# Patient Record
Sex: Male | Born: 1985
Health system: Southern US, Community
[De-identification: ages and names within clinical notes are randomized; demographics above are authoritative.]

---

## 2019-05-17 ENCOUNTER — Ambulatory Visit: Payer: Self-pay | Admitting: Registered Nurse

## 2019-05-18 ENCOUNTER — Encounter: Payer: Self-pay | Admitting: Registered Nurse

## 2020-09-27 ENCOUNTER — Ambulatory Visit: Payer: BC Managed Care – PPO | Admitting: Internal Medicine

## 2020-09-27 ENCOUNTER — Encounter: Payer: Self-pay | Admitting: Internal Medicine

## 2020-09-27 ENCOUNTER — Other Ambulatory Visit: Payer: Self-pay

## 2020-09-27 VITALS — BP 106/70 | HR 76 | Temp 98.2°F | Resp 16 | Ht 68.0 in | Wt 138.0 lb

## 2020-09-27 DIAGNOSIS — Z0001 Encounter for general adult medical examination with abnormal findings: Secondary | ICD-10-CM

## 2020-09-27 DIAGNOSIS — R300 Dysuria: Secondary | ICD-10-CM | POA: Diagnosis not present

## 2020-09-27 DIAGNOSIS — Z7251 High risk heterosexual behavior: Secondary | ICD-10-CM | POA: Insufficient documentation

## 2020-09-27 DIAGNOSIS — Z0000 Encounter for general adult medical examination without abnormal findings: Secondary | ICD-10-CM

## 2020-09-27 LAB — CBC WITH DIFFERENTIAL/PLATELET
Basophils Absolute: 0 10*3/uL (ref 0.0–0.1)
Basophils Relative: 0.5 % (ref 0.0–3.0)
Eosinophils Absolute: 0.1 10*3/uL (ref 0.0–0.7)
Eosinophils Relative: 1.3 % (ref 0.0–5.0)
HCT: 43.6 % (ref 39.0–52.0)
Hemoglobin: 14.2 g/dL (ref 13.0–17.0)
Lymphocytes Relative: 25 % (ref 12.0–46.0)
Lymphs Abs: 1.4 10*3/uL (ref 0.7–4.0)
MCHC: 32.6 g/dL (ref 30.0–36.0)
MCV: 78.3 fl (ref 78.0–100.0)
Monocytes Absolute: 0.4 10*3/uL (ref 0.1–1.0)
Monocytes Relative: 6.5 % (ref 3.0–12.0)
Neutro Abs: 3.8 10*3/uL (ref 1.4–7.7)
Neutrophils Relative %: 66.7 % (ref 43.0–77.0)
Platelets: 216 10*3/uL (ref 150.0–400.0)
RBC: 5.57 Mil/uL (ref 4.22–5.81)
RDW: 13.9 % (ref 11.5–15.5)
WBC: 5.8 10*3/uL (ref 4.0–10.5)

## 2020-09-27 LAB — URINALYSIS, ROUTINE W REFLEX MICROSCOPIC
Bilirubin Urine: NEGATIVE
Hgb urine dipstick: NEGATIVE
Ketones, ur: NEGATIVE
Leukocytes,Ua: NEGATIVE
Nitrite: NEGATIVE
Specific Gravity, Urine: 1.025 (ref 1.000–1.030)
Total Protein, Urine: NEGATIVE
Urine Glucose: NEGATIVE
Urobilinogen, UA: 0.2 (ref 0.0–1.0)
pH: 7 (ref 5.0–8.0)

## 2020-09-27 LAB — LIPID PANEL
Cholesterol: 126 mg/dL (ref 0–200)
HDL: 42.8 mg/dL (ref >39.00)
LDL Cholesterol: 72 mg/dL (ref 0–99)
NonHDL: 83.26
Total CHOL/HDL Ratio: 3
Triglycerides: 54 mg/dL (ref 0.0–149.0)
VLDL: 10.8 mg/dL (ref 0.0–40.0)

## 2020-09-27 NOTE — Patient Instructions (Signed)

## 2020-09-27 NOTE — Progress Notes (Signed)
Subjective:  Patient ID: Ricky Duran, Duran    DOB: 15-Jul-1986  Age: 34 y.o. MRN: [id number]  CC: Annual Exam  This visit occurred during the SARS-CoV-2 public health emergency.  Safety protocols were in place, including screening questions prior to the visit, additional usage of staff PPE, and extensive cleaning of exam room while observing appropriate contact time as indicated for disinfecting solutions.   Ricky Duran.  HPI Ricky Duran presents for a CPX.  He complains of a 1 week history of dysuria without penile discharge.  His last sexual encounter was 3 weeks ago when he said he met a woman in a convenience store parking lot and they had sex.  He denies testicular pain or swelling, lymphadenopathy, penile lesions or ulcers, rash, fever, chills, abdominal pain, or flank pain.  History Ricky Duran has no past medical history on file.   He has no past surgical history on file.   His family history is not on file.He reports that he has never smoked. He has never used smokeless tobacco. He reports current alcohol use of about 2.0 standard drinks of alcohol per week. He reports that he does not use drugs.  No outpatient medications prior to visit.   No facility-administered medications prior to visit.   History reviewed. No pertinent past medical history. History reviewed. No pertinent surgical history.  reports that he has never smoked. He has never used smokeless tobacco. He reports current alcohol use of about 2.0 standard drinks of alcohol per week. He reports that he does not use drugs. family history is not on file. No Known Allergies  ROS Review of Systems  Constitutional: Negative for appetite change, chills, diaphoresis, fatigue and fever.  HENT: Negative.   Eyes: Negative.   Respiratory: Negative.  Negative for cough and shortness of breath.   Cardiovascular: Negative.  Negative for chest pain and leg swelling.  Gastrointestinal: Negative.  Negative for  abdominal pain, constipation, diarrhea, nausea and vomiting.  Endocrine: Negative.   Genitourinary: Positive for dysuria. Negative for difficulty urinating, flank pain, frequency, genital sores, hematuria, penile discharge, penile pain, penile swelling, scrotal swelling, testicular pain and urgency.  Musculoskeletal: Negative for arthralgias and myalgias.  Skin: Negative for color change, pallor and rash.  Neurological: Negative.  Negative for dizziness, weakness, light-headedness and headaches.  Hematological: Negative for adenopathy. Does not bruise/bleed easily.  Psychiatric/Behavioral: Negative.     Objective:  BP 106/70   Pulse 76   Temp 98.2 F (36.8 C) (Oral)   Resp 16   Ht '5\' 8"'  (1.727 m)   Wt 138 lb (62.6 kg)   SpO2 97%   BMI 20.98 kg/m   Physical Exam Vitals reviewed.  HENT:     Nose: Nose normal.     Mouth/Throat:     Mouth: Mucous membranes are moist.  Eyes:     General: No scleral icterus.    Conjunctiva/sclera: Conjunctivae normal.  Cardiovascular:     Rate and Rhythm: Normal rate and regular rhythm.     Heart sounds: No murmur heard.   Pulmonary:     Effort: Pulmonary effort is normal.     Breath sounds: No stridor. No wheezing, rhonchi or rales.  Abdominal:     General: Abdomen is flat.     Palpations: There is no mass.     Tenderness: There is no abdominal tenderness. There is no guarding.     Hernia: There is no hernia in the left inguinal area or right inguinal area.  Genitourinary:  Pubic Area: No rash.      Penis: Normal and circumcised. No erythema, discharge, swelling or lesions.      Testes: Normal.        Right: Mass, tenderness or swelling not present.        Left: Mass, tenderness or swelling not present.     Epididymis:     Right: Normal. Not inflamed or enlarged. No mass.     Left: Not enlarged. No mass.  Musculoskeletal:        General: Normal range of motion.     Cervical back: Neck supple.     Right lower leg: No edema.      Left lower leg: No edema.  Lymphadenopathy:     Cervical: No cervical adenopathy.     Lower Body: No right inguinal adenopathy. No left inguinal adenopathy.  Skin:    General: Skin is warm and dry.     Coloration: Skin is not pale.     Findings: No rash.  Neurological:     General: No focal deficit present.     Mental Status: He is alert and oriented to person, place, and time. Mental status is at baseline.  Psychiatric:        Mood and Affect: Mood normal.        Behavior: Behavior normal.     No results found for: WBC, HGB, HCT, PLT, GLUCOSE, CHOL, TRIG, HDL, LDLDIRECT, LDLCALC, ALT, AST, NA, K, CL, CREATININE, BUN, CO2, TSH, PSA, INR, GLUF, HGBA1C, MICROALBUR  Assessment & Plan:   Ricky Duran was seen today for annual exam.  Diagnoses and all orders for this visit:  Encounter for general adult medical examination with abnormal findings- Exam completed, labs reviewed, vaccines reviewed - He refused a flu vaccine, no cancer screenings are indicated. -     HIV Antibody (routine testing w rflx); Future -     Lipid panel; Future  Dysuria- Testing for infection is negative.  If his symptoms persist I will treat him for nongonococcal urethritis. -     Chlamydia/Gonococcus/Trichomonas, NAA; Future -     Urinalysis, Routine w reflex microscopic; Future -     RPR; Future -     CULTURE, URINE COMPREHENSIVE; Future -     CBC with Differential/Platelet; Future  High risk heterosexual behavior -     RPR; Future -     HIV Antibody (routine testing w rflx); Future   Ricky Knittel "Na-eem" does not currently have medications on file.  No orders of the defined types were placed in this encounter.    Follow-up: No follow-ups on file.  Ricky Calico, MD

## 2020-09-29 LAB — HIV ANTIBODY (ROUTINE TESTING W REFLEX): HIV 1&2 Ab, 4th Generation: NONREACTIVE

## 2020-09-29 LAB — CULTURE, URINE COMPREHENSIVE: RESULT:: NO GROWTH

## 2020-09-29 LAB — RPR: RPR Ser Ql: NONREACTIVE

## 2020-10-02 ENCOUNTER — Encounter: Payer: Self-pay | Admitting: Internal Medicine

## 2020-10-02 LAB — CHLAMYDIA/GONOCOCCUS/TRICHOMONAS, NAA
Chlamydia by NAA: NEGATIVE
Gonococcus by NAA: NEGATIVE
Trich vag by NAA: NEGATIVE

## 2020-10-19 ENCOUNTER — Ambulatory Visit (INDEPENDENT_AMBULATORY_CARE_PROVIDER_SITE_OTHER): Payer: BC Managed Care – PPO

## 2020-10-19 ENCOUNTER — Ambulatory Visit: Payer: BC Managed Care – PPO | Admitting: Internal Medicine

## 2020-10-19 ENCOUNTER — Encounter: Payer: Self-pay | Admitting: Internal Medicine

## 2020-10-19 ENCOUNTER — Other Ambulatory Visit: Payer: Self-pay

## 2020-10-19 VITALS — BP 102/60 | HR 84 | Temp 98.5°F | Resp 16 | Ht 68.0 in | Wt 137.0 lb

## 2020-10-19 DIAGNOSIS — R109 Unspecified abdominal pain: Secondary | ICD-10-CM

## 2020-10-19 LAB — URINALYSIS, ROUTINE W REFLEX MICROSCOPIC
Bilirubin Urine: NEGATIVE
Hgb urine dipstick: NEGATIVE
Ketones, ur: NEGATIVE
Leukocytes,Ua: NEGATIVE
Nitrite: NEGATIVE
RBC / HPF: NONE SEEN (ref 0–?)
Specific Gravity, Urine: 1.025 (ref 1.000–1.030)
Total Protein, Urine: NEGATIVE
Urine Glucose: NEGATIVE
Urobilinogen, UA: 0.2 (ref 0.0–1.0)
pH: 6.5 (ref 5.0–8.0)

## 2020-10-19 NOTE — Patient Instructions (Signed)
Flank Pain, Adult Flank pain is pain that is located on the side of the body between the upper abdomen and the back. This area is called the flank. The pain may occur over a short period of time (acute), or it may be long-term or recurring (chronic). It may be mild or severe. Flank pain can be caused by many things, including:  Muscle soreness or injury.  Kidney stones or kidney disease.  Stress.  A disease of the spine (vertebral disk disease).  A lung infection (pneumonia).  Fluid around the lungs (pulmonary edema).  A skin rash caused by the chickenpox virus (shingles).  Tumors that affect the back of the abdomen.  Gallbladder disease. Follow these instructions at home:   Drink enough fluid to keep your urine clear or pale yellow.  Rest as told by your health care provider.  Take over-the-counter and prescription medicines only as told by your health care provider.  Keep a journal to track what has caused your flank pain and what has made it feel better.  Keep all follow-up visits as told by your health care provider. This is important. Contact a health care provider if:  Your pain is not controlled with medicine.  You have new symptoms.  Your pain gets worse.  You have a fever.  Your symptoms last longer than 2-3 days.  You have trouble urinating or you are urinating very frequently. Get help right away if:  You have trouble breathing or you are short of breath.  Your abdomen hurts or it is swollen or red.  You have nausea or vomiting.  You feel faint or you pass out.  You have blood in your urine. Summary  Flank pain is pain that is located on the side of the body between the upper abdomen and the back.  The pain may occur over a short period of time (acute), or it may be long-term or recurring (chronic). It may be mild or severe.  Flank pain can be caused by many things.  Contact your health care provider if your symptoms get worse or they last  longer than 2-3 days. This information is not intended to replace advice given to you by your health care provider. Make sure you discuss any questions you have with your health care provider. Document Revised: 09/12/2017 Document Reviewed: 12/13/2016 Elsevier Patient Education  2020 [employer]  

## 2020-10-19 NOTE — Progress Notes (Signed)
Subjective:  Patient ID: Ricky Duran, male    DOB: 1985/11/16  Age: 35 y.o. MRN: [id number]  CC: Abdominal Pain  This visit occurred during the SARS-CoV-2 public health emergency.  Safety protocols were in place, including screening questions prior to the visit, additional usage of staff PPE, and extensive cleaning of exam room while observing appropriate contact time as indicated for disinfecting solutions.    HPI Ricky Duran presents for f/up - I recently saw him for dysuria and concerns for urethritis.  The testing was all normal.  He tells me the dysuria has resolved.  He returns today because he has noticed over the last 3 weeks that whenever he drinks beer he has discomfort in both flanks.  He denies abdominal pain, nausea, vomiting, loss of appetite, weight loss, dysuria, or hematuria.  No outpatient medications prior to visit.   No facility-administered medications prior to visit.    ROS Review of Systems  Constitutional: Negative.  Negative for chills, diaphoresis, fatigue and fever.  HENT: Negative.   Eyes: Negative for visual disturbance.  Respiratory: Negative for cough, chest tightness, shortness of breath and wheezing.   Cardiovascular: Negative for chest pain, palpitations and leg swelling.  Gastrointestinal: Negative for abdominal pain, constipation, diarrhea, nausea and vomiting.  Endocrine: Negative.   Genitourinary: Positive for flank pain. Negative for difficulty urinating, dysuria, hematuria, scrotal swelling and testicular pain.  Musculoskeletal: Negative for arthralgias and back pain.  Skin: Negative.  Negative for color change and pallor.  Neurological: Negative.  Negative for dizziness, weakness, light-headedness and numbness.  Hematological: Negative for adenopathy. Does not bruise/bleed easily.  Psychiatric/Behavioral: Negative.     Objective:  BP 102/60   Pulse 84   Temp 98.5 F (36.9 C) (Oral)   Resp 16   Ht 5\' 8"  (1.727 m)   Wt 137  lb (62.1 kg)   SpO2 98%   BMI 20.83 kg/m   BP Readings from Last 3 Encounters:  10/19/20 102/60  09/27/20 106/70    Wt Readings from Last 3 Encounters:  10/19/20 137 lb (62.1 kg)  09/27/20 138 lb (62.6 kg)    Physical Exam Vitals reviewed.  Constitutional:      Appearance: He is well-developed.  HENT:     Nose: Nose normal.     Mouth/Throat:     Mouth: Mucous membranes are moist.  Eyes:     General: No scleral icterus.    Conjunctiva/sclera: Conjunctivae normal.  Cardiovascular:     Rate and Rhythm: Normal rate and regular rhythm.     Heart sounds: No murmur heard.   Pulmonary:     Effort: Pulmonary effort is normal.     Breath sounds: No stridor. No wheezing, rhonchi or rales.  Abdominal:     General: Abdomen is flat. Bowel sounds are normal. There is no distension.     Palpations: Abdomen is soft. There is no hepatomegaly, splenomegaly or mass.     Tenderness: There is no abdominal tenderness. There is no right CVA tenderness or left CVA tenderness.  Musculoskeletal:        General: Normal range of motion.     Cervical back: Neck supple.     Right lower leg: No edema.     Left lower leg: No edema.  Lymphadenopathy:     Cervical: No cervical adenopathy.  Skin:    General: Skin is warm and dry.     Findings: No rash.  Neurological:     General: No focal deficit present.  Mental Status: He is alert.  Psychiatric:        Mood and Affect: Mood normal.        Behavior: Behavior normal.     Lab Results  Component Value Date   WBC 5.8 09/27/2020   HGB 14.2 09/27/2020   HCT 43.6 09/27/2020   PLT 216.0 09/27/2020   CHOL 126 09/27/2020   TRIG 54.0 09/27/2020   HDL 42.80 09/27/2020   LDLCALC 72 09/27/2020   FINDINGS: There is no evidence of dilated bowel loops or free intraperitoneal air. No radiopaque calculi or other significant radiographic abnormality is seen. Heart size and mediastinal contours are within normal limits. Both lungs are  clear.  IMPRESSION: Negative abdominal radiographs.  No acute cardiopulmonary disease.   Electronically Signed   By: Marlan Palau M.D.   On: 10/19/2020 14:07   Assessment & Plan:   [name] was seen today for abdominal pain.  Diagnoses and all orders for this visit:  Bilateral flank pain- He has a strange discomfort in his flanks only when he drinks beer.  His examination is reassuring.  His urinalysis and plain films are normal.  I think this is either dyspepsia or musculoskeletal pain.  I recommended that he stop drinking beer. -     Urinalysis, Routine w reflex microscopic; Future -     DG ABD ACUTE 2+V W 1V CHEST; Future -     Urinalysis, Routine w reflex microscopic   Ricky Duran "Na-eem" does not currently have medications on file.  No orders of the defined types were placed in this encounter.    Follow-up: Return if symptoms worsen or fail to improve.  Sanda Linger, MD

## 2020-11-30 ENCOUNTER — Telehealth (INDEPENDENT_AMBULATORY_CARE_PROVIDER_SITE_OTHER): Payer: BC Managed Care – PPO | Admitting: Family Medicine

## 2020-11-30 DIAGNOSIS — J111 Influenza due to unidentified influenza virus with other respiratory manifestations: Secondary | ICD-10-CM

## 2020-11-30 NOTE — Addendum Note (Signed)
Addended by: Terressa Koyanagi on: 11/30/2020 03:13 PM   Modules accepted: Level of Service

## 2020-11-30 NOTE — Progress Notes (Signed)
Virtual Visit via Video Note  I connected with Ricky Duran  on 11/30/20 at  3:00 PM EST by a video enabled telemedicine application and verified that I am speaking with the correct person using two identifiers.  Location patient: home, [place] Location provider:work or home office Persons participating in the virtual visit: patient, provider  I discussed the limitations of evaluation and management by telemedicine and the availability of in person appointments. The patient expressed understanding and agreed to proceed.   HPI:  Acute telemedicine visit for "flu": -Onset: 5 days ago -Symptoms include: fever, sore throat, ear discomfort, body aches, runny nose, cough -feeling much better now with resolution of most symptoms -took covid test 3 days ago - negative; he has a nother test scheduled for tomorrow -Denies: SOB, CP, NVD, inability to eat/drink -Pertinent past medical history:none per pt -Pertinent medication allergies:nkda -COVID-19 vaccine status: not vaccinated for covid or flu  ROS: See pertinent positives and negatives per HPI.  No past medical history on file.  No past surgical history on file.  No current outpatient medications on file.  EXAM:  VITALS per patient if applicable:  GENERAL: alert, oriented, appears well and in no acute distress  HEENT: atraumatic, conjunttiva clear, no obvious abnormalities on inspection of external nose and ears  NECK: normal movements of the head and neck  LUNGS: on inspection no signs of respiratory distress, breathing rate appears normal, no obvious gross SOB, gasping or wheezing  CV: no obvious cyanosis  MS: moves all visible extremities without noticeable abnormality  PSYCH/NEURO: pleasant and cooperative, no obvious depression or anxiety, speech and thought processing grossly intact  ASSESSMENT AND PLAN:  Discussed the following assessment and plan:  Influenza-like illness  -we discussed possible serious and likely  etiologies, options for evaluation and workup, limitations of telemedicine visit vs in person visit, treatment, treatment risks and precautions. Pt prefers to treat via telemedicine empirically rather than in person at this moment.  Query viral illness, influenza versus other.  He plans to do a second Covid test, as there have been a fair number of false negative test recently with the variant.  He reports he is doing mostly better at this point and declines any prescriptions for symptomatic care.  He does want a work note.   Work/School slipped offered: provided in patient instructions   Scheduled follow up with PCP offered: Advised to schedule follow-up visit through his primary care office if needed if any recurrent symptoms, worsening or new symptoms arise. Advised to seek prompt in person care if worsening or new symptoms.  Discussed options for inperson care if PCP office not available. Did let this patient know that I only do telemedicine on Tuesdays and Thursdays for [place]. Advised to schedule follow up visit with PCP or [facility] if any further questions or concerns to avoid delays in care.   I discussed the assessment and treatment plan with the patient. The patient was provided an opportunity to ask questions and all were answered. The patient agreed with the plan and demonstrated an understanding of the instructions.     Terressa Koyanagi, DO

## 2020-11-30 NOTE — Patient Instructions (Addendum)
   ---------------------------------------------------------------------------------------------------------------------------      WORK SLIP:  Patient Ricky Duran,  [birth year], was seen for a medical visit today, 11/30/20 . Please excuse from work for a COVID like illness. We advise 10 days minimum from the onset of symptoms (11/24/20) PLUS 1 day of no fever and improved symptoms. Will defer to employer for a sooner return to work if symptoms have resolved, it is greater than 5 days since the positive test and the patient can wear a high-quality, tight fitting mask such as N95 or KN95 at all times for an additional 5 days. Would also suggest COVID19 antigen testing is negative prior to return.  Sincerely: E-signature: Dr. Kriste Basque, DO [facility] Ph: [phone]   ------------------------------------------------------------------------------------------------------------------------------    HOME CARE TIPS:   -can use tylenol or aleve if needed for fevers, aches and pains per instructions  -can use nasal saline a few times per day if you have nasal congestion  -can try cough lozenges, warm liquids or over the counter cough medications if needed (do not mix multiple cough medications)  -stay hydrated, drink plenty of fluids and eat small healthy meals - avoid dairy  -can take 1000 IU ( ) Vit D3 and 100-500 mg of Vit C daily per instructions  -If the Covid test is positive, check out the [facility] website for more information on home care, transmission and treatment for COVID19  -follow up with your doctor in 2-3 days unless improving and feeling better  -stay home while sick, except to seek medical care, and if you have COVID19 ideally it would be best to stay home for a full 10 days since the onset of symptoms PLUS one day of no fever and feeling better. Wear a good mask (such as N95 or KN95) if around others to reduce the risk of transmission.  It  was nice to meet you today, and I really hope you are feeling better soon. I help [place] out with telemedicine visits on Tuesdays and Thursdays and am available for visits on those days. If you have any concerns or questions following this visit please schedule a follow up visit with your Primary Care doctor or seek care at a local urgent care clinic to avoid delays in care.    Seek in person care or schedule a follow up video visit promptly if your symptoms worsen, new concerns arise or you are not improving with treatment. Call 911 and/or seek emergency care if your symptoms are severe or life threatening.

## 2021-01-30 ENCOUNTER — Ambulatory Visit: Payer: BC Managed Care – PPO | Admitting: Internal Medicine

## 2021-07-05 ENCOUNTER — Other Ambulatory Visit: Payer: Self-pay

## 2021-07-05 ENCOUNTER — Ambulatory Visit: Payer: BC Managed Care – PPO | Admitting: Nurse Practitioner

## 2021-07-05 ENCOUNTER — Encounter: Payer: Self-pay | Admitting: Nurse Practitioner

## 2021-07-05 VITALS — BP 118/80 | HR 88 | Temp 98.0°F | Wt 127.0 lb

## 2021-07-05 DIAGNOSIS — R300 Dysuria: Secondary | ICD-10-CM | POA: Diagnosis not present

## 2021-07-05 LAB — URINALYSIS WITH CULTURE, IF INDICATED
Bilirubin Urine: NEGATIVE
Hgb urine dipstick: NEGATIVE
Ketones, ur: NEGATIVE
Leukocytes,Ua: NEGATIVE
Nitrite: NEGATIVE
RBC / HPF: NONE SEEN (ref 0–?)
Specific Gravity, Urine: <=1.005 — AB (ref 1.000–1.030)
Total Protein, Urine: NEGATIVE
Urine Glucose: NEGATIVE
Urobilinogen, UA: 0.2 (ref 0.0–1.0)
WBC, UA: NONE SEEN (ref 0–?)
pH: 6 (ref 5.0–8.0)

## 2021-07-05 LAB — COMPREHENSIVE METABOLIC PANEL
ALT: 7 U/L (ref 0–53)
AST: 12 U/L (ref 0–37)
Albumin: 4.6 g/dL (ref 3.5–5.2)
Alkaline Phosphatase: 49 U/L (ref 39–117)
BUN: 11 mg/dL (ref 6–23)
CO2: 28 mEq/L (ref 19–32)
Calcium: 9.5 mg/dL (ref 8.4–10.5)
Chloride: 100 mEq/L (ref 96–112)
Creatinine, Ser: 0.97 mg/dL (ref 0.40–1.50)
GFR: 101.8 mL/min (ref >60.00)
Glucose, Bld: 89 mg/dL (ref 70–99)
Potassium: 3.9 mEq/L (ref 3.5–5.1)
Sodium: 136 mEq/L (ref 135–145)
Total Bilirubin: 0.8 mg/dL (ref 0.2–1.2)
Total Protein: 7.3 g/dL (ref 6.0–8.3)

## 2021-07-05 LAB — CBC WITH DIFFERENTIAL/PLATELET
Basophils Absolute: 0 10*3/uL (ref 0.0–0.1)
Basophils Relative: 0.9 % (ref 0.0–3.0)
Eosinophils Absolute: 0.1 10*3/uL (ref 0.0–0.7)
Eosinophils Relative: 1.1 % (ref 0.0–5.0)
HCT: 42.8 % (ref 39.0–52.0)
Hemoglobin: 14 g/dL (ref 13.0–17.0)
Lymphocytes Relative: 33 % (ref 12.0–46.0)
Lymphs Abs: 1.7 10*3/uL (ref 0.7–4.0)
MCHC: 32.6 g/dL (ref 30.0–36.0)
MCV: 77.6 fl — ABNORMAL LOW (ref 78.0–100.0)
Monocytes Absolute: 0.4 10*3/uL (ref 0.1–1.0)
Monocytes Relative: 8.5 % (ref 3.0–12.0)
Neutro Abs: 3 10*3/uL (ref 1.4–7.7)
Neutrophils Relative %: 56.5 % (ref 43.0–77.0)
Platelets: 209 10*3/uL (ref 150.0–400.0)
RBC: 5.52 Mil/uL (ref 4.22–5.81)
RDW: 13.8 % (ref 11.5–15.5)
WBC: 5.3 10*3/uL (ref 4.0–10.5)

## 2021-07-05 NOTE — Progress Notes (Signed)
Subjective:  Patient ID: Ricky Duran, male    DOB: 10/19/1985  Age: 35 y.o. MRN: [id number]  CC:  Chief Complaint  Patient presents with   Painful urination    Pt states symptom is the same symptoms when he saw MD 6 months ago   Dysuria      HPI  This patient arrives today for the above.  He states his symptoms for this episode started approximately 10 days ago.  He is having dysuria with urination.  He denies any penile discharge, he tells me he has not had any sexual intercourse for the last couple of months.  He did have a similar episode about 6 months ago and UA at that time was negative, he was also tested for STIs which were negative, CBC was normal, and cholesterol panel is normal.  Abdominal x-ray was also completed which was negative.  Does me after about 3 weeks his symptoms seem to resolve on their own.  No past medical history on file.    No family history on file.  Social History   Social History Narrative   Not on file   Social History   Tobacco Use   Smoking status: Never   Smokeless tobacco: Never  Substance Use Topics   Alcohol use: Yes    Alcohol/week: 2.0 standard drinks    Types: 2 Cans of beer per week     No outpatient medications have been marked as taking for the 07/05/21 encounter (Office Visit) with Ailene Ards, NP.    ROS:  Review of Systems  Constitutional:  Negative for chills, fever and malaise/fatigue.  Gastrointestinal:  Negative for abdominal pain, diarrhea, nausea and vomiting.  Genitourinary:  Positive for dysuria and flank pain (when wake up). Negative for frequency and hematuria.       (-) foul smelling    Objective:   Today's Vitals: BP 118/80 (BP Location: Left Arm)   Pulse 88   Temp 98 F (36.7 C) (Oral)   Wt 127 lb (57.6 kg)   SpO2 98%   BMI 19.31 kg/m  Vitals with BMI 07/05/2021 10/19/2020 09/27/2020  Height - '5\' 8"'  '5\' 8"'   Weight 127 lbs 137 lbs 138 lbs  BMI - 01.75 10.25  Systolic 852 778 242   Diastolic 80 60 70  Pulse 88 84 76     Physical Exam Vitals reviewed.  Constitutional:      Appearance: Normal appearance.  HENT:     Head: Normocephalic and atraumatic.  Cardiovascular:     Rate and Rhythm: Normal rate and regular rhythm.  Pulmonary:     Effort: Pulmonary effort is normal.     Breath sounds: Normal breath sounds.  Abdominal:     General: Abdomen is flat. Bowel sounds are normal.     Palpations: Abdomen is soft.     Tenderness: There is no abdominal tenderness. There is no right CVA tenderness or left CVA tenderness.  Musculoskeletal:     Cervical back: Neck supple.  Skin:    General: Skin is warm and dry.  Neurological:     Mental Status: He is alert and oriented to person, place, and time.  Psychiatric:        Mood and Affect: Mood normal.        Behavior: Behavior normal.        Thought Content: Thought content normal.        Judgment: Judgment normal.  Assessment and Plan   1. Dysuria      Plan: 1.  Etiology unclear at this time will check UA with reflex to culture,'s test very gonorrhea and chlamydia, check CBC, and check CMP.  He was recommended to drink water regularly throughout the day to remain hydrated.  Further recommendations will be made based upon his testing results.  We will also refer him to urology for further assistance with evaluating and managing his symptoms.   Tests ordered Orders Placed This Encounter  Procedures   Chlamydia/Neisseria Gonorrhoeae RNA,TMA,Urogenital   Urinalysis with Culture, if indicated   CBC with Differential/Platelet   Comp Met (CMET)   Ambulatory referral to Urology      No orders of the defined types were placed in this encounter.   Patient to follow-up in 3 to 6 months for complete physical exam with Dr. Ronnald Ramp or sooner as needed.  Ailene Ards, NP

## 2021-07-07 LAB — CHLAMYDIA/NEISSERIA GONORRHOEAE RNA,TMA,UROGENTIAL
C. trachomatis RNA, TMA: NOT DETECTED
N. gonorrhoeae RNA, TMA: NOT DETECTED

## 2021-07-12 ENCOUNTER — Encounter: Payer: Self-pay | Admitting: Nurse Practitioner

## 2021-10-09 ENCOUNTER — Encounter: Payer: Self-pay | Admitting: Internal Medicine

## 2021-10-09 ENCOUNTER — Ambulatory Visit (INDEPENDENT_AMBULATORY_CARE_PROVIDER_SITE_OTHER): Payer: Managed Care, Other (non HMO) | Admitting: Internal Medicine

## 2021-10-09 ENCOUNTER — Other Ambulatory Visit: Payer: Self-pay

## 2021-10-09 VITALS — BP 106/62 | HR 89 | Temp 98.3°F | Ht 68.0 in | Wt 135.0 lb

## 2021-10-09 DIAGNOSIS — Z0000 Encounter for general adult medical examination without abnormal findings: Secondary | ICD-10-CM

## 2021-10-09 NOTE — Progress Notes (Signed)
Subjective:  Patient ID: Ricky Duran, male    DOB: 1986/09/05  Age: 35 y.o. MRN: [id number]  CC: Annual Exam  This visit occurred during the SARS-CoV-2 public health emergency.  Safety protocols were in place, including screening questions prior to the visit, additional usage of staff PPE, and extensive cleaning of exam room while observing appropriate contact time as indicated for disinfecting solutions.    HPI Ricky Duran presents for a CPX.  He recently saw a urologist for chronic prostatitis.  He says his symptoms are improving with the combination of an alpha-blocker and an NSAID.  He denies dysuria, hematuria, penile lesions, discharge, or lymphadenopathy.  No outpatient medications prior to visit.   No facility-administered medications prior to visit.    ROS Review of Systems  Constitutional:  Negative for diaphoresis and fatigue.  HENT: Negative.    Eyes: Negative.   Respiratory:  Negative for cough, chest tightness, shortness of breath and wheezing.   Cardiovascular:  Negative for chest pain, palpitations and leg swelling.  Gastrointestinal:  Negative for abdominal pain, constipation, diarrhea and vomiting.  Endocrine: Negative.   Genitourinary: Negative.  Negative for difficulty urinating, dysuria, scrotal swelling and testicular pain.  Musculoskeletal: Negative.   Skin: Negative.   Neurological: Negative.  Negative for dizziness and weakness.  Hematological: Negative.  Negative for adenopathy. Does not bruise/bleed easily.  Psychiatric/Behavioral: Negative.     Objective:  BP 106/62 (BP Location: Left Arm, Patient Position: Sitting, Cuff Size: Large)    Pulse 89    Temp 98.3 F (36.8 C) (Oral)    Ht 5\' 8"  (1.727 m)    Wt 135 lb (61.2 kg)    SpO2 96%    BMI 20.53 kg/m   BP Readings from Last 3 Encounters:  10/09/21 106/62  07/05/21 118/80  10/19/20 102/60    Wt Readings from Last 3 Encounters:  10/09/21 135 lb (61.2 kg)  07/05/21 127 lb (57.6 kg)   10/19/20 137 lb (62.1 kg)    Physical Exam Vitals reviewed.  HENT:     Nose: Nose normal.     Mouth/Throat:     Mouth: Mucous membranes are moist.  Eyes:     General: No scleral icterus.    Conjunctiva/sclera: Conjunctivae normal.  Cardiovascular:     Rate and Rhythm: Normal rate and regular rhythm.     Heart sounds: No murmur heard. Pulmonary:     Effort: Pulmonary effort is normal.     Breath sounds: No stridor. No wheezing, rhonchi or rales.  Abdominal:     General: Abdomen is flat.     Palpations: There is no mass.     Tenderness: There is no abdominal tenderness. There is no guarding.  Musculoskeletal:        General: Normal range of motion.     Cervical back: Neck supple.  Lymphadenopathy:     Cervical: No cervical adenopathy.  Skin:    General: Skin is warm.  Neurological:     General: No focal deficit present.     Mental Status: He is alert.  Psychiatric:        Mood and Affect: Mood normal.    Lab Results  Component Value Date   WBC 5.3 07/05/2021   HGB 14.0 07/05/2021   HCT 42.8 07/05/2021   PLT 209.0 07/05/2021   GLUCOSE 89 07/05/2021   CHOL 126 09/27/2020   TRIG 54.0 09/27/2020   HDL 42.80 09/27/2020   LDLCALC 72 09/27/2020   ALT 7 07/05/2021  AST 12 07/05/2021   NA 136 07/05/2021   K 3.9 07/05/2021   CL 100 07/05/2021   CREATININE 0.97 07/05/2021   BUN 11 07/05/2021   CO2 28 07/05/2021    Patient was never admitted.  Assessment & Plan:   Ricky Duran was seen today for annual exam.  Diagnoses and all orders for this visit:  Routine general medical examination at a health care facility- Exam completed, no labs indicated, he refused a flu vaccine, pt ed material was given.   Ricky Bin "Naim" does not currently have medications on file.  No orders of the defined types were placed in this encounter.    Follow-up: Return if symptoms worsen or fail to improve.  Scarlette Calico, MD

## 2021-10-09 NOTE — Patient Instructions (Signed)

## 2021-10-10 DIAGNOSIS — Z0000 Encounter for general adult medical examination without abnormal findings: Secondary | ICD-10-CM | POA: Insufficient documentation

## 2021-12-17 IMAGING — DX DG ABDOMEN ACUTE W/ 1V CHEST
3 series · 3 of 3 positions shown · non-contrast
Comparison: None.

CLINICAL DATA: Bilateral flank pain

EXAM:
DG ABDOMEN ACUTE WITH 1 VIEW CHEST

[chest pa]
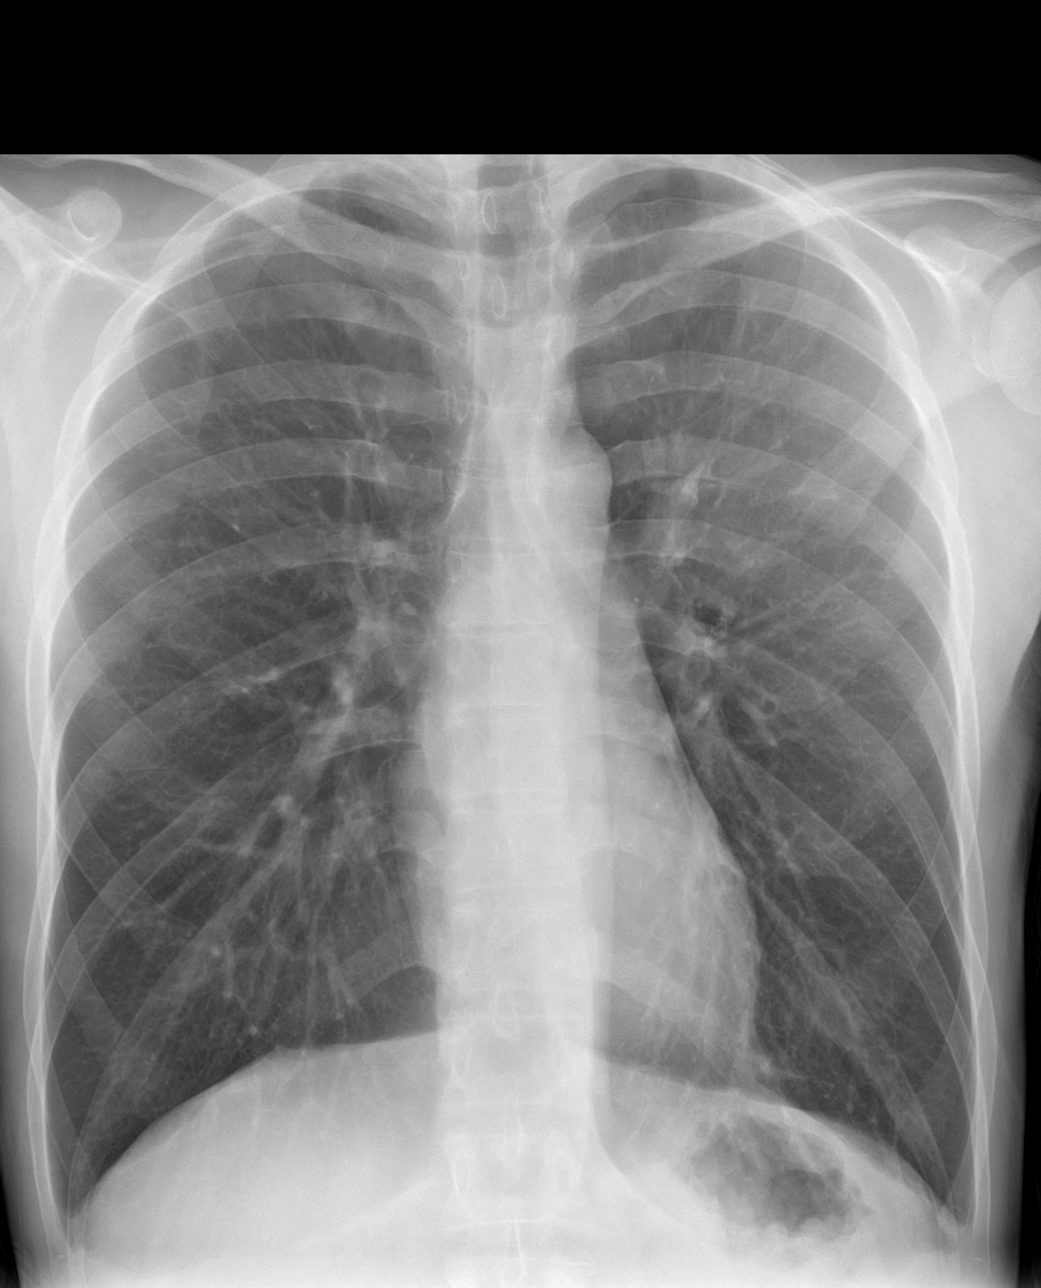

[abdomen erect]
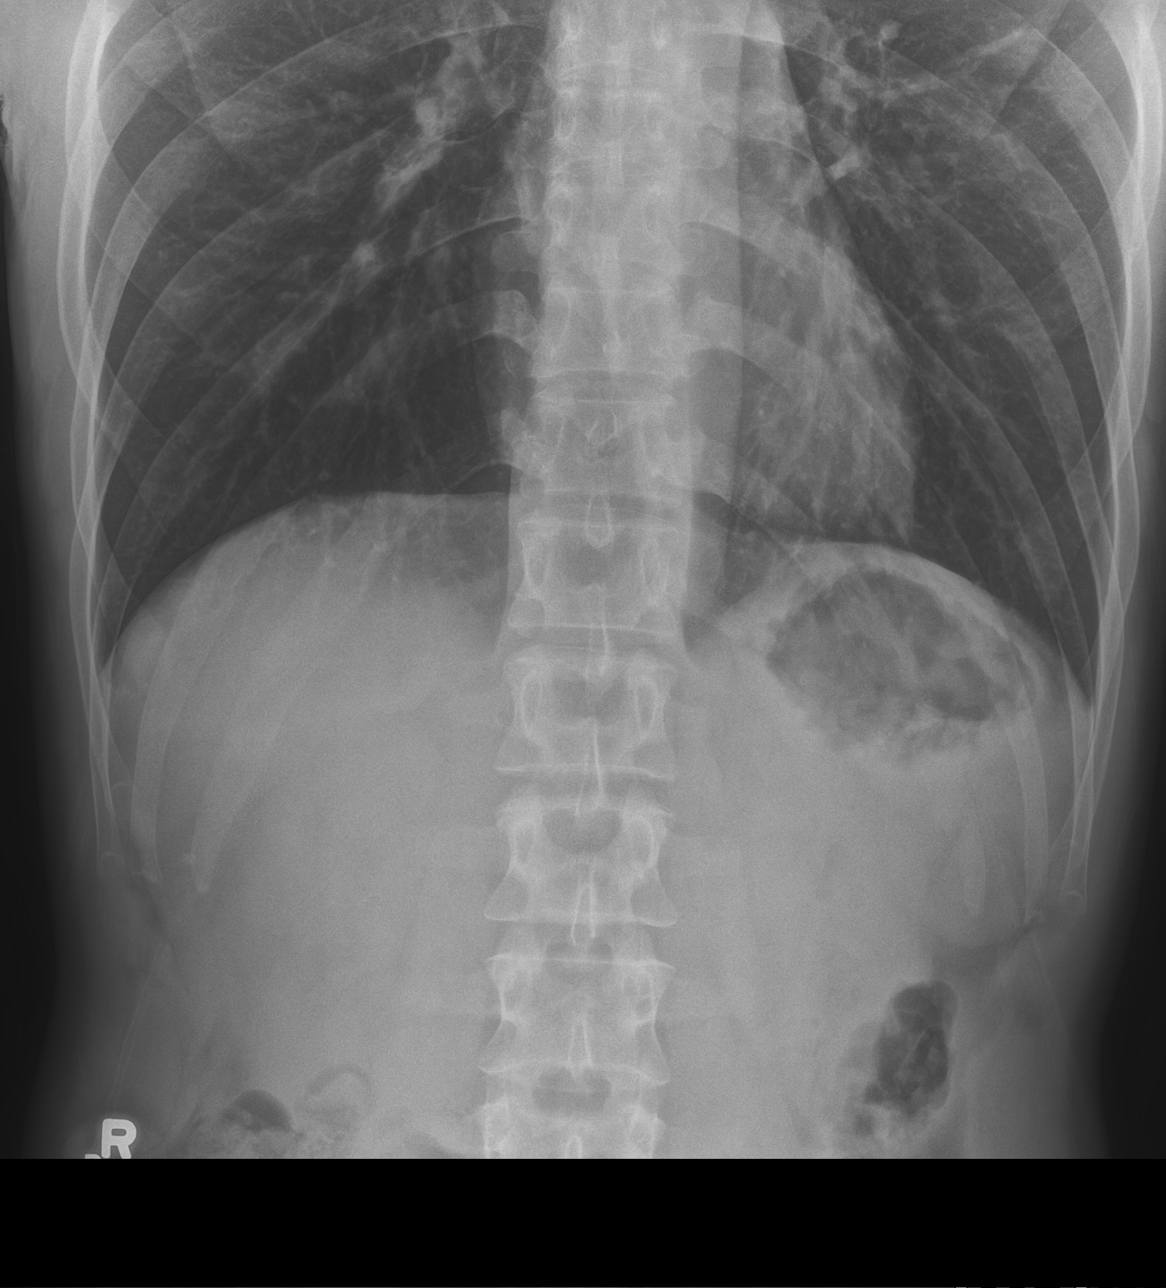

[abdomen supine]
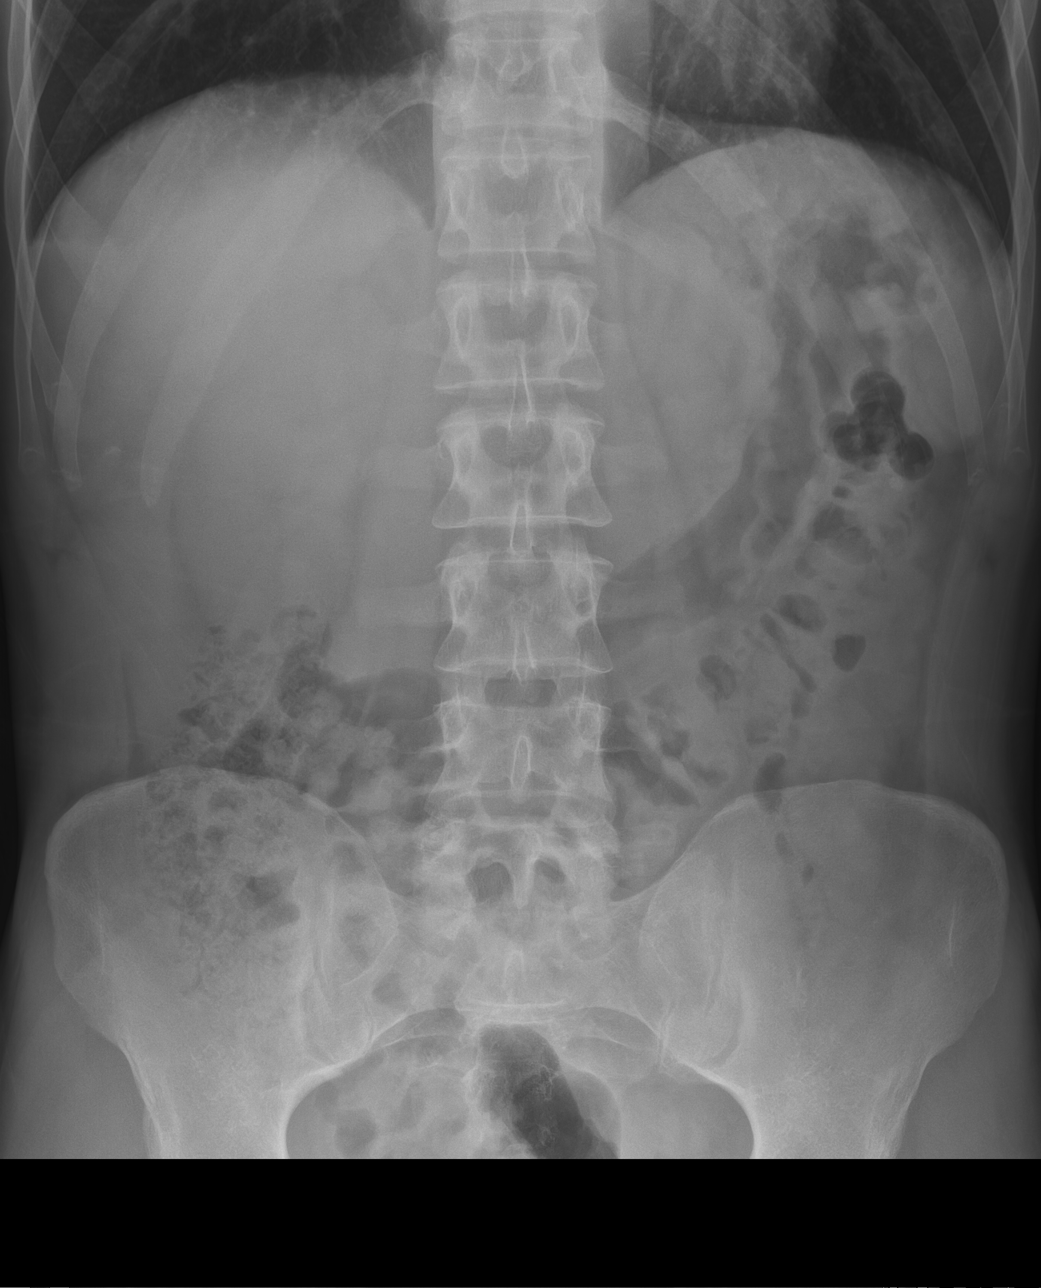

[3 of 3 positions shown; findings below may reference images not displayed]

FINDINGS: There is no evidence of dilated bowel loops or free intraperitoneal
air. No radiopaque calculi or other significant radiographic
abnormality is seen. Heart size and mediastinal contours are within
normal limits. Both lungs are clear.
IMPRESSION: Negative abdominal radiographs.  No acute cardiopulmonary disease.

## 2022-10-09 ENCOUNTER — Ambulatory Visit: Payer: Managed Care, Other (non HMO) | Admitting: Internal Medicine

## 2022-10-15 ENCOUNTER — Encounter: Payer: Self-pay | Admitting: Internal Medicine

## 2022-10-15 ENCOUNTER — Ambulatory Visit: Payer: Managed Care, Other (non HMO) | Admitting: Internal Medicine

## 2022-10-15 VITALS — BP 110/70 | HR 85 | Temp 97.7°F | Resp 16 | Ht 68.0 in | Wt 138.0 lb

## 2022-10-15 DIAGNOSIS — M545 Low back pain, unspecified: Secondary | ICD-10-CM | POA: Insufficient documentation

## 2022-10-15 DIAGNOSIS — Z0000 Encounter for general adult medical examination without abnormal findings: Secondary | ICD-10-CM

## 2022-10-15 DIAGNOSIS — G8929 Other chronic pain: Secondary | ICD-10-CM | POA: Diagnosis not present

## 2022-10-15 NOTE — Patient Instructions (Signed)

## 2022-10-15 NOTE — Progress Notes (Signed)
Subjective:  Patient ID: Ricky Duran, male    DOB: February 10, 1986  Age: 37 y.o. MRN: [id number]  CC: Annual Exam and Back Pain   HPI Ricky Duran presents for a CPX and f/up -  He complains of LBP for > one year.  He denies trauma or injury.  The pain does not radiate and he denies lower extremity paresthesias.  He intermittently takes meloxicam and it controls the pain.  He continues to have urinary symptoms and tells me he sees a urologist later this week.  No outpatient medications prior to visit.   No facility-administered medications prior to visit.    ROS Review of Systems  Constitutional: Negative.  Negative for chills, diaphoresis, fatigue and fever.  HENT: Negative.    Respiratory: Negative.  Negative for cough and shortness of breath.   Cardiovascular:  Negative for chest pain, palpitations and leg swelling.  Gastrointestinal:  Negative for abdominal pain, diarrhea, nausea and vomiting.  Genitourinary:  Positive for difficulty urinating. Negative for dysuria, hematuria, penile swelling, scrotal swelling and testicular pain.  Musculoskeletal:  Positive for back pain. Negative for myalgias and neck pain.  Skin:  Negative for color change and pallor.  Neurological: Negative.  Negative for dizziness and weakness.  Hematological:  Negative for adenopathy. Does not bruise/bleed easily.  Psychiatric/Behavioral: Negative.      Objective:  BP 110/70 (BP Location: Left Arm, Patient Position: Sitting, Cuff Size: Large)   Pulse 85   Temp 97.7 F (36.5 C) (Oral)   Resp 16   Ht 5\' 8"  (1.727 m)   Wt 138 lb (62.6 kg)   SpO2 99%   BMI 20.98 kg/m   BP Readings from Last 3 Encounters:  10/15/22 110/70  10/09/21 106/62  07/05/21 118/80    Wt Readings from Last 3 Encounters:  10/15/22 138 lb (62.6 kg)  10/09/21 135 lb (61.2 kg)  07/05/21 127 lb (57.6 kg)    Physical Exam Vitals reviewed.  HENT:     Nose: Nose normal.     Mouth/Throat:     Mouth: Mucous  membranes are moist.  Eyes:     General: No scleral icterus.    Conjunctiva/sclera: Conjunctivae normal.  Cardiovascular:     Rate and Rhythm: Normal rate and regular rhythm.     Heart sounds: No murmur heard. Pulmonary:     Effort: Pulmonary effort is normal.     Breath sounds: No stridor. No wheezing, rhonchi or rales.  Abdominal:     Tenderness: There is no abdominal tenderness. There is no guarding or rebound.     Hernia: No hernia is present.  Musculoskeletal:        General: Normal range of motion.     Cervical back: Neck supple.     Thoracic back: Normal.     Lumbar back: Normal. No bony tenderness. Normal range of motion. Negative right straight leg raise test and negative left straight leg raise test.     Right lower leg: No edema.     Left lower leg: No edema.  Lymphadenopathy:     Cervical: No cervical adenopathy.  Skin:    General: Skin is warm and dry.  Neurological:     General: No focal deficit present.     Mental Status: Mental status is at baseline.  Psychiatric:        Mood and Affect: Mood normal.        Behavior: Behavior normal.     Lab Results  Component Value Date  WBC 5.3 07/05/2021   HGB 14.0 07/05/2021   HCT 42.8 07/05/2021   PLT 209.0 07/05/2021   GLUCOSE 89 07/05/2021   CHOL 126 09/27/2020   TRIG 54.0 09/27/2020   HDL 42.80 09/27/2020   LDLCALC 72 09/27/2020   ALT 7 07/05/2021   AST 12 07/05/2021   NA 136 07/05/2021   K 3.9 07/05/2021   CL 100 07/05/2021   CREATININE 0.97 07/05/2021   BUN 11 07/05/2021   CO2 28 07/05/2021    Patient was never admitted.  Assessment & Plan:   Yer was seen today for annual exam and back pain.  Diagnoses and all orders for this visit:  Chronic bilateral low back pain without sciatica- I have asked him to undergo a plain film of his lower back.  Will continue meloxicam as needed.  He is neurologically intact. -     DG Lumbar Spine Complete; Future  Routine general medical examination at a  health care facility- Exam completed, no labs indicated, he refused vaccines today, no cancer screenings indicated.   Ricky Duran "Ricky Duran" does not currently have medications on file.  No orders of the defined types were placed in this encounter.    Follow-up: Return in about 6 months (around 04/15/2023).  Ricky Calico, MD

## 2022-12-19 ENCOUNTER — Ambulatory Visit: Payer: Managed Care, Other (non HMO)

## 2022-12-19 ENCOUNTER — Encounter: Payer: Self-pay | Admitting: Internal Medicine

## 2022-12-19 ENCOUNTER — Ambulatory Visit: Payer: Managed Care, Other (non HMO) | Admitting: Internal Medicine

## 2022-12-19 VITALS — BP 108/78 | HR 71 | Temp 97.7°F | Ht 68.0 in | Wt 145.0 lb

## 2022-12-19 DIAGNOSIS — Z1159 Encounter for screening for other viral diseases: Secondary | ICD-10-CM | POA: Insufficient documentation

## 2022-12-19 DIAGNOSIS — R0789 Other chest pain: Secondary | ICD-10-CM | POA: Diagnosis not present

## 2022-12-19 MED ORDER — IBUPROFEN 600 MG PO TABS: 600.0000 mg | ORAL_TABLET | Freq: Three times a day (TID) | ORAL | 0 refills | Status: DC | PRN

## 2022-12-19 NOTE — Progress Notes (Signed)
Subjective:  Patient ID: Ricky Duran, male    DOB: Apr 27, 1986  Age: 37 y.o. MRN: [id number]  CC: Chest Pain   HPI Ricky Duran presents for f/up ----  He complains of a 2-week history of pain around his right shoulder blade.  He does not recall a specific trauma or injury.  He describes the pain as an achy sensation that is exacerbated by movement.  He has not taken anything for the pain.  He denies cough, hemoptysis, pleuritic discomfort, rash, or lymphadenopathy.  No outpatient medications prior to visit.   No facility-administered medications prior to visit.    ROS Review of Systems  Constitutional: Negative.  Negative for diaphoresis and fatigue.  HENT: Negative.    Eyes: Negative.   Cardiovascular:  Positive for chest pain. Negative for palpitations and leg swelling.  Gastrointestinal:  Negative for abdominal pain and diarrhea.  Endocrine: Negative.   Genitourinary: Negative.  Negative for difficulty urinating.  Musculoskeletal:  Negative for back pain.  Skin: Negative.   Neurological: Negative.  Negative for dizziness and weakness.  Hematological:  Negative for adenopathy. Does not bruise/bleed easily.  Psychiatric/Behavioral: Negative.      Objective:  BP 108/78 (BP Location: Left Arm, Patient Position: Sitting, Cuff Size: Normal)   Pulse 71   Temp 97.7 F (36.5 C) (Oral)   Ht '5\' 8"'$  (1.727 m)   Wt 145 lb (65.8 kg)   SpO2 98%   BMI 22.05 kg/m   BP Readings from Last 3 Encounters:  12/19/22 108/78  10/15/22 110/70  10/09/21 106/62    Wt Readings from Last 3 Encounters:  12/19/22 145 lb (65.8 kg)  10/15/22 138 lb (62.6 kg)  10/09/21 135 lb (61.2 kg)    Physical Exam Vitals reviewed.  Constitutional:      Appearance: He is not ill-appearing.  Eyes:     General: No scleral icterus.    Conjunctiva/sclera: Conjunctivae normal.  Cardiovascular:     Rate and Rhythm: Normal rate and regular rhythm.     Heart sounds: No murmur heard.    No  friction rub. No gallop.  Pulmonary:     Effort: Pulmonary effort is normal.     Breath sounds: No stridor. No decreased breath sounds, wheezing, rhonchi or rales.  Chest:     Chest wall: No mass, deformity, swelling or tenderness.  Abdominal:     General: Abdomen is flat.     Palpations: There is no mass.     Tenderness: There is no abdominal tenderness. There is no guarding.     Hernia: No hernia is present.  Musculoskeletal:        General: Normal range of motion.     Right lower leg: No edema.     Left lower leg: No edema.  Lymphadenopathy:     Upper Body:     Right upper body: No axillary adenopathy.     Left upper body: No axillary adenopathy.  Skin:    General: Skin is warm and dry.     Coloration: Skin is not pale.  Neurological:     General: No focal deficit present.     Mental Status: He is alert. Mental status is at baseline.     Lab Results  Component Value Date   WBC 5.3 07/05/2021   HGB 14.0 07/05/2021   HCT 42.8 07/05/2021   PLT 209.0 07/05/2021   GLUCOSE 89 07/05/2021   CHOL 126 09/27/2020   TRIG 54.0 09/27/2020   HDL 42.80 09/27/2020  LDLCALC 72 09/27/2020   ALT 7 07/05/2021   AST 12 07/05/2021   NA 136 07/05/2021   K 3.9 07/05/2021   CL 100 07/05/2021   CREATININE 0.97 07/05/2021   BUN 11 07/05/2021   CO2 28 07/05/2021    DG Chest 2 View  Result Date: 12/19/2022 CLINICAL DATA:  right posterior chest pain EXAM: CHEST - 2 VIEW COMPARISON:  10/19/2020 FINDINGS: Cardiac silhouette is unremarkable. No pneumothorax or pleural effusion. The lungs are clear. The visualized skeletal structures are unremarkable. IMPRESSION: No acute cardiopulmonary process. Electronically Signed   By: Sammie Bench M.D.   On: 12/19/2022 11:06     Assessment & Plan:   Ricky Duran was seen today for chest pain.  Diagnoses and all orders for this visit:  Need for hepatitis C screening test -     Hepatitis C antibody; Future -     Hepatitis C antibody  Right-sided  chest wall pain- Based on his symptoms, exam, and normal x-ray I think he has musculoskeletal pain.  Will treat with an NSAID. -     DG Chest 2 View; Future -     ibuprofen (ADVIL) 600 MG tablet; Take 1 tablet (600 mg total) by mouth every 8 (eight) hours as needed.   I am having Ricky Anderle "Naim" start on ibuprofen.  Meds ordered this encounter  Medications   ibuprofen (ADVIL) 600 MG tablet    Sig: Take 1 tablet (600 mg total) by mouth every 8 (eight) hours as needed.    Dispense:  30 tablet    Refill:  0     Follow-up: Return if symptoms worsen or fail to improve.  Scarlette Calico, MD

## 2022-12-19 NOTE — Patient Instructions (Signed)
Chest Wall Pain Chest wall pain is pain in or around the bones and muscles of your chest. Sometimes, an injury causes this pain. Excessive coughing or overuse of arm and chest muscles may also cause chest wall pain. Sometimes, the cause may not be known. This pain may take several weeks or longer to get better. Follow these instructions at home: Managing pain, stiffness, and swelling  If directed, put ice on the painful area: Put ice in a plastic bag. Place a towel between your skin and the bag. Leave the ice on for 20 minutes, 2-3 times per day. Activity Rest as told by your health care provider. Avoid activities that cause pain. These include any activities that use your chest muscles or your abdominal and side muscles to lift heavy items. Ask your health care provider what activities are safe for you. General instructions  Take over-the-counter and prescription medicines only as told by your health care provider. Do not use any products that contain nicotine or tobacco, such as cigarettes, e-cigarettes, and chewing tobacco. These can delay healing after injury. If you need help quitting, ask your health care provider. Keep all follow-up visits as told by your health care provider. This is important. Contact a health care provider if: You have a fever. Your chest pain becomes worse. You have new symptoms. Get help right away if: You have nausea or vomiting. You feel sweaty or light-headed. You have a cough with mucus from your lungs (sputum) or you cough up blood. You develop shortness of breath. These symptoms may represent a serious problem that is an emergency. Do not wait to see if the symptoms will go away. Get medical help right away. Call your local emergency services (911 in the [place] Do not drive yourself to the hospital. Summary Chest wall pain is pain in or around the bones and muscles of your chest. Depending on the cause, it may be treated with ice, rest, medicines, and  avoiding activities that cause pain. Contact a health care provider if you have a fever, worsening chest pain, or new symptoms. Get help right away if you feel light-headed or you develop shortness of breath. These symptoms may be an emergency. This information is not intended to replace advice given to you by your health care provider. Make sure you discuss any questions you have with your health care provider. Document Revised: 12/12/2020 Document Reviewed: 12/15/2020 Elsevier Patient Education  [employer].

## 2022-12-20 LAB — HEPATITIS C ANTIBODY: Hepatitis C Ab: NONREACTIVE

## 2023-05-11 ENCOUNTER — Ambulatory Visit
Admission: RE | Admit: 2023-05-11 | Discharge: 2023-05-11 | Disposition: A | Discharge status: Home / Self Care | Payer: Managed Care, Other (non HMO) | Source: Ambulatory Visit | Attending: Physician Assistant | Admitting: Physician Assistant

## 2023-05-11 VITALS — BP 112/77 | HR 68 | Temp 98.4°F | Resp 14

## 2023-05-11 DIAGNOSIS — Z113 Encounter for screening for infections with a predominantly sexual mode of transmission: Secondary | ICD-10-CM

## 2023-05-11 DIAGNOSIS — R202 Paresthesia of skin: Secondary | ICD-10-CM | POA: Diagnosis present

## 2023-05-11 NOTE — ED Triage Notes (Signed)
Pt c/o sore throat and tingling in body onset Friday afternoon. Pt denies fever.

## 2023-05-11 NOTE — ED Provider Notes (Signed)
[facility]    CSN: [id number] Arrival date & time: 05/11/23  0914      History   Chief Complaint Chief Complaint  Patient presents with   Exposure to STD    inflammations of the body like bee stings( sometimes at my eyes too), a little pain in the throat - Entered by patient    HPI Ricky Duran is a 37 y.o. male.   Patient presents with mild sore throat that started several days ago.  Denies fever, cough, congestion.  He has taken nothing for the symptoms.  Patient also reports intermittent tingling to his entire body.  Nothing seems to make the symptoms better or worse.  He denies weakness, numbness,muscle aches or cramps, fatigue, loss of appetite, pain.  He worked out yesterday without difficulty.  He has never experienced the symptoms before.  He is requesting lab work including STD screening.    History reviewed. No pertinent past medical history.  Patient Active Problem List   Diagnosis Date Noted   Need for hepatitis C screening test 12/19/2022   Right-sided chest wall pain 12/19/2022   Chronic bilateral low back pain without sciatica 10/15/2022   Routine general medical examination at a health care facility 10/10/2021   High risk heterosexual behavior 09/27/2020    History reviewed. No pertinent surgical history.     Home Medications    Prior to Admission medications   Medication Sig Start Date End Date Taking? Authorizing Provider  ibuprofen (ADVIL) 600 MG tablet Take 1 tablet (600 mg total) by mouth every 8 (eight) hours as needed. 12/19/22   Etta Grandchild, MD    Family History No family history on file.  Social History Social History   Tobacco Use   Smoking status: Some Days    Types: Cigarettes   Smokeless tobacco: Never  Vaping Use   Vaping status: Never Used  Substance Use Topics   Alcohol use: Yes    Comment: occasionally   Drug use: Never     Allergies   Patient has no known allergies.   Review of Systems Review of  Systems  Constitutional:  Negative for chills and fever.  HENT:  Positive for sore throat. Negative for ear pain.   Eyes:  Negative for pain and visual disturbance.  Respiratory:  Negative for cough and shortness of breath.   Cardiovascular:  Negative for chest pain and palpitations.  Gastrointestinal:  Negative for abdominal pain and vomiting.  Genitourinary:  Negative for dysuria and hematuria.  Musculoskeletal:  Negative for arthralgias and back pain.  Skin:  Negative for color change and rash.  Neurological:  Negative for seizures and syncope.  All other systems reviewed and are negative.    Physical Exam Triage Vital Signs ED Triage Vitals  Encounter Vitals Group     BP 05/11/23 0932 112/77     Systolic BP Percentile --      Diastolic BP Percentile --      Pulse Rate 05/11/23 0932 68     Resp 05/11/23 0932 14     Temp 05/11/23 0932 98.4 F (36.9 C)     Temp Source 05/11/23 0932 Oral     SpO2 05/11/23 0932 98 %     Weight --      Height --      Head Circumference --      Peak Flow --      Pain Score 05/11/23 0930 0     Pain Loc --  Pain Education --      Exclude from Growth Chart --    No data found.  Updated Vital Signs BP 112/77 (BP Location: Right Arm)   Pulse 68   Temp 98.4 F (36.9 C) (Oral)   Resp 14   SpO2 98%   Visual Acuity Right Eye Distance:   Left Eye Distance:   Bilateral Distance:    Right Eye Near:   Left Eye Near:    Bilateral Near:     Physical Exam Vitals and nursing note reviewed.  Constitutional:      General: He is not in acute distress.    Appearance: He is well-developed.  HENT:     Head: Normocephalic and atraumatic.  Eyes:     Conjunctiva/sclera: Conjunctivae normal.  Cardiovascular:     Rate and Rhythm: Normal rate and regular rhythm.     Heart sounds: No murmur heard. Pulmonary:     Effort: Pulmonary effort is normal. No respiratory distress.     Breath sounds: Normal breath sounds.  Abdominal:     Palpations:  Abdomen is soft.     Tenderness: There is no abdominal tenderness.  Musculoskeletal:        General: No swelling.     Cervical back: Neck supple.  Skin:    General: Skin is warm and dry.     Capillary Refill: Capillary refill takes less than 2 seconds.  Neurological:     Mental Status: He is alert.  Psychiatric:        Mood and Affect: Mood normal.      UC Treatments / Results  Labs (all labs ordered are listed, but only abnormal results are displayed) Labs Reviewed  CBC  HIV ANTIBODY (ROUTINE TESTING W REFLEX)  RPR  CYTOLOGY, (ORAL, ANAL, URETHRAL) ANCILLARY ONLY    EKG   Radiology No results found.  Procedures Procedures (including critical care time)  Medications Ordered in UC Medications - No data to display  Initial Impression / Assessment and Plan / UC Course  I have reviewed the triage vital signs and the nursing notes.  Pertinent labs & imaging results that were available during my care of the patient were reviewed by me and considered in my medical decision making (see chart for details).     Unclear etiology of tingling.  Patient denies numbness, weakness.  Able to workout yesterday without difficulty.  Will check CBC today, evaluate for anemia.  Patient requesting STD screenings.  At this time besides tingling otherwise asymptomatic.  Advised follow-up with primary care physician for further evaluation. Final Clinical Impressions(s) / UC Diagnoses   Final diagnoses:  Screen for STD (sexually transmitted disease)  Tingling in extremities     Discharge Instructions      Recommend a daily multivitamin  Will call with test results and treat if indicated Make a follow up appointment with a primary care physician    ED Prescriptions   None    PDMP not reviewed this encounter.   Ward, Tylene Fantasia, PA-C 05/11/23 1015

## 2023-05-11 NOTE — Discharge Instructions (Addendum)
Recommend a daily multivitamin  Will call with test results and treat if indicated Make a follow up appointment with a primary care physician

## 2023-05-20 ENCOUNTER — Ambulatory Visit: Payer: Managed Care, Other (non HMO) | Admitting: Internal Medicine

## 2023-05-20 ENCOUNTER — Encounter: Payer: Self-pay | Admitting: Internal Medicine

## 2023-05-20 VITALS — BP 102/64 | HR 76 | Temp 98.3°F | Ht 68.0 in | Wt 138.0 lb

## 2023-05-20 DIAGNOSIS — R202 Paresthesia of skin: Secondary | ICD-10-CM

## 2023-05-20 DIAGNOSIS — J029 Acute pharyngitis, unspecified: Secondary | ICD-10-CM | POA: Insufficient documentation

## 2023-05-20 DIAGNOSIS — Z7251 High risk heterosexual behavior: Secondary | ICD-10-CM

## 2023-05-20 LAB — CBC WITH DIFFERENTIAL/PLATELET
Basophils Absolute: 0.1 10*3/uL (ref 0.0–0.1)
Basophils Relative: 0.9 % (ref 0.0–3.0)
Eosinophils Absolute: 0.3 10*3/uL (ref 0.0–0.7)
Eosinophils Relative: 4.1 % (ref 0.0–5.0)
HCT: 46.5 % (ref 39.0–52.0)
Hemoglobin: 14.7 g/dL (ref 13.0–17.0)
Lymphocytes Relative: 29.7 % (ref 12.0–46.0)
Lymphs Abs: 2 10*3/uL (ref 0.7–4.0)
MCHC: 31.6 g/dL (ref 30.0–36.0)
MCV: 78 fl (ref 78.0–100.0)
Monocytes Absolute: 0.5 10*3/uL (ref 0.1–1.0)
Monocytes Relative: 8.2 % (ref 3.0–12.0)
Neutro Abs: 3.8 10*3/uL (ref 1.4–7.7)
Neutrophils Relative %: 57.1 % (ref 43.0–77.0)
Platelets: 216 10*3/uL (ref 150.0–400.0)
RBC: 5.96 Mil/uL — ABNORMAL HIGH (ref 4.22–5.81)
RDW: 14.4 % (ref 11.5–15.5)
WBC: 6.6 10*3/uL (ref 4.0–10.5)

## 2023-05-20 LAB — HEMOGLOBIN A1C: Hgb A1c MFr Bld: 5.8 % (ref 4.6–6.5)

## 2023-05-20 LAB — HEPATIC FUNCTION PANEL
ALT: 11 U/L (ref 0–53)
AST: 17 U/L (ref 0–37)
Albumin: 4.7 g/dL (ref 3.5–5.2)
Alkaline Phosphatase: 59 U/L (ref 39–117)
Bilirubin, Direct: 0 mg/dL (ref 0.0–0.3)
Total Bilirubin: 0.4 mg/dL (ref 0.2–1.2)
Total Protein: 7.8 g/dL (ref 6.0–8.3)

## 2023-05-20 LAB — BASIC METABOLIC PANEL
BUN: 10 mg/dL (ref 6–23)
CO2: 28 mEq/L (ref 19–32)
Calcium: 10.3 mg/dL (ref 8.4–10.5)
Chloride: 100 mEq/L (ref 96–112)
Creatinine, Ser: 1.03 mg/dL (ref 0.40–1.50)
GFR: 93.48 mL/min (ref >60.00)
Glucose, Bld: 100 mg/dL — ABNORMAL HIGH (ref 70–99)
Potassium: 4.5 mEq/L (ref 3.5–5.1)
Sodium: 138 mEq/L (ref 135–145)

## 2023-05-20 LAB — FOLATE: Folate: 21.5 ng/mL (ref >5.9)

## 2023-05-20 LAB — C-REACTIVE PROTEIN: CRP: <1 mg/dL (ref 0.5–20.0)

## 2023-05-20 LAB — TSH: TSH: 3.23 u[IU]/mL (ref 0.35–5.50)

## 2023-05-20 NOTE — Patient Instructions (Signed)
Paresthesia Paresthesia is a burning or prickling feeling. This feeling can happen in any part of the body. It often happens in the hands, arms, legs, or feet. Usually, it is not painful. In most cases, the feeling goes away in a short time and is not a sign of a serious problem. If you have paresthesia that lasts a long time, you need to see your doctor. Follow these instructions at home: Nutrition Eat a healthy diet. This includes: Eating foods that are high in fiber. These include beans, whole grains, and fresh fruits and vegetables. Limiting foods that are high in fat and sugar. These include fried or sweet foods.  Alcohol use  Do not drink alcohol if: Your doctor tells you not to drink. You are pregnant, may be pregnant, or are planning to become pregnant. If you drink alcohol: Limit how much you have to: 0-1 drink a day for women. 0-2 drinks a day for men. Know how much alcohol is in your drink. In the [place] one drink equals one 12 oz bottle of beer (355 mL), one 5 oz glass of wine (148 mL), or one 1 oz glass of hard liquor (44 mL). General instructions Take over-the-counter and prescription medicines only as told by your doctor. Do not smoke or use any products that contain nicotine or tobacco. If you need help quitting, ask your doctor. If you have diabetes, work with your doctor to make sure your blood sugar stays in a healthy range. If your feet feel numb: Check for redness, warmth, and swelling every day. Wear padded socks and comfortable shoes. These help protect your feet. Keep all follow-up visits. Contact a doctor if: You have paresthesia that gets worse or does not go away. You lose feeling (have numbness) after an injury. Your burning or prickling feeling gets worse when you walk. You have pain or cramps. You feel dizzy or you faint. You have a rash. Get help right away if: You feel weak or have new weakness in an arm or leg. You have trouble walking or  moving. You have problems speaking, understanding, or seeing. You feel confused. You cannot control when you pee (urinate) or poop (have a bowel movement). These symptoms may be an emergency. Get help right away. Call 911. Do not wait to see if the symptoms will go away. Do not drive yourself to the hospital. Summary Paresthesia is a burning or prickling feeling. It often happens in the hands, arms, legs, or feet. In most cases, the feeling goes away in a short time and is not a sign of a serious problem. If you have paresthesia that lasts a long time, you need to be seen by your doctor. This information is not intended to replace advice given to you by your health care provider. Make sure you discuss any questions you have with your health care provider. Document Revised: 06/11/2021 Document Reviewed: 06/11/2021 Elsevier Patient Education  2024 Elsevier Inc.  

## 2023-05-20 NOTE — Progress Notes (Unsigned)
Subjective:  Patient ID: Ricky Duran, male    DOB: 15-Mar-1986  Age: 37 y.o. MRN: [id number]  CC: Follow-up   HPI Ricky Duran presents for f/up ---  Discussed the use of AI scribe software for clinical note transcription with the patient, who gave verbal consent to proceed.  History of Present Illness   The patient, with an unspecified medical history, presented with a two-week history of generalized tingling sensations. The sensations, described as similar to a bee sting, were noted to occur throughout the body, including the arms, legs, eyes, hands, and face. The patient denied any associated pain, weakness, or numbness. The tingling was significant enough to interrupt activities such as swimming due to discomfort.  Approximately 2 weeks prior to the onset of the tingling, the patient experienced a sore throat and sought care at an urgent care center. The patient underwent blood tests, the results of which were not disclosed during the conversation. The sore throat has since resolved.  In the week preceding the conversation, the patient also experienced a headache, but denied any blurred vision, rash, swollen lymph nodes, night sweats, or diarrhea. The patient reported a perceived weight gain, however, records indicate a weight loss of seven pounds since March. The cause of this weight loss is unknown to the patient.       Outpatient Medications Prior to Visit  Medication Sig Dispense Refill   ibuprofen (ADVIL) 600 MG tablet Take 1 tablet (600 mg total) by mouth every 8 (eight) hours as needed. 30 tablet 0   No facility-administered medications prior to visit.    ROS Review of Systems  Psychiatric/Behavioral:  The patient is nervous/anxious.     Objective:  BP 102/64 (BP Location: Left Arm, Patient Position: Sitting, Cuff Size: Large)   Pulse 76   Temp 98.3 F (36.8 C) (Oral)   Ht 5\' 8"  (1.727 m)   Wt 138 lb (62.6 kg)   SpO2 97%   BMI 20.98 kg/m   BP Readings  from Last 3 Encounters:  05/20/23 102/64  05/11/23 112/77  12/19/22 108/78    Wt Readings from Last 3 Encounters:  05/20/23 138 lb (62.6 kg)  12/19/22 145 lb (65.8 kg)  10/15/22 138 lb (62.6 kg)    Physical Exam Vitals reviewed.  Constitutional:      Appearance: Normal appearance.  HENT:     Nose: Nose normal.     Mouth/Throat:     Mouth: Mucous membranes are moist.  Eyes:     General: No scleral icterus.    Extraocular Movements: Extraocular movements intact.     Conjunctiva/sclera: Conjunctivae normal.     Pupils: Pupils are equal, round, and reactive to light.  Cardiovascular:     Rate and Rhythm: Normal rate and regular rhythm.     Heart sounds: No murmur heard.    No friction rub. No gallop.  Pulmonary:     Effort: Pulmonary effort is normal.     Breath sounds: No stridor. No wheezing, rhonchi or rales.  Abdominal:     General: Abdomen is flat.     Palpations: There is no mass.     Tenderness: There is no abdominal tenderness. There is no guarding.     Hernia: No hernia is present.  Musculoskeletal:        General: Normal range of motion.     Cervical back: Neck supple.     Right lower leg: No edema.     Left lower leg: No edema.  Lymphadenopathy:  Cervical: No cervical adenopathy.  Skin:    General: Skin is warm and dry.     Findings: No rash.  Neurological:     General: No focal deficit present.     Mental Status: He is alert. Mental status is at baseline.     Cranial Nerves: Cranial nerves 2-12 are intact.     Sensory: Sensation is intact.     Motor: Motor function is intact. No weakness or atrophy.     Coordination: Coordination is intact. Romberg sign negative. Coordination normal. Finger-Nose-Finger Test and Heel to [facility] Test normal. Rapid alternating movements normal.     Gait: Gait is intact. Gait and tandem walk normal.     Deep Tendon Reflexes: Babinski sign absent on the right side. Babinski sign absent on the left side.     Reflex Scores:       Tricep reflexes are 1+ on the right side and 1+ on the left side.      Bicep reflexes are 1+ on the right side and 1+ on the left side.      Brachioradialis reflexes are 1+ on the right side and 1+ on the left side.      Patellar reflexes are 1+ on the right side and 1+ on the left side.      Achilles reflexes are 1+ on the right side and 1+ on the left side. Psychiatric:        Attention and Perception: He is inattentive.        Mood and Affect: Mood is anxious. Mood is not depressed. Affect is not labile or flat.        Speech: He is communicative. Speech is tangential.        Behavior: Behavior normal.        Thought Content: Thought content normal. Thought content is not paranoid or delusional. Thought content does not include homicidal or suicidal ideation.        Cognition and Memory: Cognition normal.     Lab Results  Component Value Date   WBC 6.6 05/20/2023   HGB 14.7 05/20/2023   HCT 46.5 05/20/2023   PLT 216.0 05/20/2023   GLUCOSE 100 (H) 05/20/2023   CHOL 126 09/27/2020   TRIG 54.0 09/27/2020   HDL 42.80 09/27/2020   LDLCALC 72 09/27/2020   ALT 11 05/20/2023   AST 17 05/20/2023   NA 138 05/20/2023   K 4.5 05/20/2023   CL 100 05/20/2023   CREATININE 1.03 05/20/2023   BUN 10 05/20/2023   CO2 28 05/20/2023   TSH 3.23 05/20/2023   HGBA1C 5.8 05/20/2023    No results found.  Assessment & Plan:  Paresthesias -     TSH; Future -     C-reactive protein; Future -     Hemoglobin A1c; Future -     Hepatic function panel; Future -     Folate; Future -     Basic metabolic panel; Future -     B. burgdorfi antibodies by WB; Future -     CBC with Differential/Platelet; Future -     HIV Antibody (routine testing w rflx); Future  Pharyngitis, unspecified etiology -     RPR; Future -     C-reactive protein; Future -     B. burgdorfi antibodies by WB; Future -     CBC with Differential/Platelet; Future -     HIV Antibody (routine testing w rflx); Future  High  risk heterosexual behavior -  RPR; Future     Follow-up: Return in about 3 months (around 08/20/2023).  Sanda Linger, MD

## 2023-05-21 ENCOUNTER — Encounter: Payer: Self-pay | Admitting: Internal Medicine

## 2023-06-05 ENCOUNTER — Other Ambulatory Visit: Payer: Self-pay | Admitting: Internal Medicine

## 2023-06-05 DIAGNOSIS — R202 Paresthesia of skin: Secondary | ICD-10-CM

## 2023-06-06 ENCOUNTER — Encounter: Payer: Self-pay | Admitting: Neurology

## 2023-06-09 NOTE — Progress Notes (Unsigned)
Initial neurology clinic note  Reason for Evaluation: Consultation requested by Etta Grandchild, MD for an opinion regarding paresthesias. My final recommendations will be communicated back to the requesting physician by way of shared medical record or letter to requesting physician via [place] mail.  HPI: This is Mr. Ricky Duran, a 37 y.o. right-handed male with no medical history who presents to neurology clinic with the chief complaint of whole body paresthesias. The patient is alone today. History is assisted by certified interpreter (french) by phone.  Symptoms started about 4 weeks ago with abnormal sensation in entire body, including the face, and was on both sides of the body. He describes the symptoms as feeling like a bee sting. It was constant for the first 3 weeks, then started coming and going over the last week and changed a bit. The tingling is now on the left side of his face and is associated with headaches. The symptoms in his body come and go. He describes the headaches as frontal headache, like a cold sensation in his head. It will come and go. It will not occur during sleep. It may start after a 4-5 hours after waking up. It lasts for many hours. He has a headache every day. The headaches started when his symptoms started 4 weeks ago, but was not as strong as they are currently. He denies photophobia, phonophobia, nausea, vomiting, or neck pain. He has not taken anything for the symptoms.  Prior to symptoms, he did have a sore throat. He had labs that did not show anything and his resolved. He has never had similar symptoms in the past. He did not really have headaches prior to this (no previous history of headaches).  He denies any muscle weakness, but does occasionally have cramps in his feet.  Patient is not on any medications and does not take any supplements. He does not drink caffeine or energy drinks.  He does not smoke. He drinks EtOH only occasionally. He denies  drug use.  Family history of neuropathy/myopathy/neurologic disease/headaches? No  He does not report any constitutional symptoms like fever, night sweats, anorexia or unintentional weight loss.  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   MEDICATIONS:  No outpatient encounter medications on file as of 06/12/2023.   No facility-administered encounter medications on file as of 06/12/2023.    PAST MEDICAL HISTORY: No past medical history on file.  PAST SURGICAL HISTORY: No past surgical history on file.  ALLERGIES: No Known Allergies  FAMILY HISTORY: No family history on file.  SOCIAL HISTORY: Social History   Tobacco Use   Smoking status: Some Days    Types: Cigarettes   Smokeless tobacco: Never  Vaping Use   Vaping status: Never Used  Substance Use Topics   Alcohol use: Yes    Comment: occasionally   Drug use: Never   Social History   Social History Narrative   Are you right handed or left handed? Right   Are you currently employed ?    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   Who lives with you? roommate   What type of home do you live in: 1 story or 2 story? one    Caffeine none     OBJECTIVE: PHYSICAL EXAM: BP 104/66   Pulse 70   Ht 5\' 9"  (1.753 m)   Wt 141 lb (64 kg)   SpO2 98%   BMI 20.82 kg/m  General: No acute distress.  Patient appears well-groomed.   Head:  Normocephalic/atraumatic Neck: supple, no paraspinal tenderness, full range of motion Lungs: Non-labored breathing on room air   Neurological Exam: Mental status: alert and oriented, speech fluent and not dysarthric, language intact.  Cranial nerves: CN I: not tested CN II: pupils equal, round and reactive to light, visual fields intact CN III, IV, VI:  full range of motion, no nystagmus, no ptosis CN V: facial sensation intact. CN VII: upper and lower face symmetric CN VIII: hearing intact CN IX, X: uvula  midline CN XI: sternocleidomastoid and trapezius muscles intact CN XII: tongue midline  Bulk & Tone: normal, no fasciculations. Motor:  muscle strength 5/5 throughout Deep Tendon Reflexes:  2+ throughout,  toes downgoing.   Sensation:  Pinprick and vibratory sensation intact. Finger to nose testing:  Without dysmetria.   Gait:  Normal station and stride.  Romberg negative.   Lab and Test Review: Internal labs: 05/20/23: TSH wnl RPR non-reactive HIV non-reactive CRP wnl HbA1c: 5.8 Hepatic function panel wnl BMP unremarkable CBC w/ diff unremarkable Folate wnl   ASSESSMENT: Ricky Duran is a 37 y.o. male who presents for evaluation of diffuse paresthesias and headaches. He has no relevant medical history. His neurological examination is normal today. Available diagnostic data is significant for HbA1c of 5.8, normal folate, HIV and RPR non-reactive, TSH normal, CRP normal. The etiology of patient's symptoms is currently unclear. It is difficult to localize symptoms. To involve the face, the brain is the only localization, but to be on both sides and affect only sensory is unlikely. A vitamin deficiency is certainly possible. His headaches do not have migrainous sound, but there may be limitations of language barrier, so symptoms being secondary to atypical migraine is also possible. I will send labs and treat with a medrol dose pak to break current headache cycle and see if this may also resolve paresthesias. He was not a headache person prior, so it is unclear if he will need a headache medication going forward. I will monitor this closely.  PLAN: -Blood work: B1, B6, B12, IFE -May consider MRI brain w/wo contrast - though likely lower yield given diffuse, sensory only symptoms -Will give Medrol dose pak to break current headache cycle -May consider headache medication if symptoms persist -Patient to call with persistent or worsening symptoms  -Return to clinic in 1 month  The  impression above as well as the plan as outlined below were extensively discussed with the patient who voiced understanding. All questions were answered to their satisfaction.  When available, results of the above investigations and possible further recommendations will be communicated to the patient via telephone/MyChart. Patient to call office if not contacted after expected testing turnaround time.   Total time spent reviewing records, interview, history/exam, documentation, and coordination of care on day of encounter:  50 min   Thank you for allowing me to participate in patient's care.  If I can answer any additional questions, I would be pleased to do so.  Jacquelyne Balint, MD   CC: Etta Grandchild, MD [address] [place] [address]  CC: Referring provider: Etta Grandchild, MD [address] [place],  [place] [address]

## 2023-06-12 ENCOUNTER — Other Ambulatory Visit (INDEPENDENT_AMBULATORY_CARE_PROVIDER_SITE_OTHER): Payer: Managed Care, Other (non HMO)

## 2023-06-12 ENCOUNTER — Ambulatory Visit: Payer: Managed Care, Other (non HMO) | Admitting: Neurology

## 2023-06-12 ENCOUNTER — Encounter: Payer: Self-pay | Admitting: Neurology

## 2023-06-12 VITALS — BP 104/66 | HR 70 | Ht 69.0 in | Wt 141.0 lb

## 2023-06-12 DIAGNOSIS — R209 Unspecified disturbances of skin sensation: Secondary | ICD-10-CM

## 2023-06-12 DIAGNOSIS — R519 Headache, unspecified: Secondary | ICD-10-CM

## 2023-06-12 MED ORDER — METHYLPREDNISOLONE 4 MG PO TBPK
ORAL_TABLET | ORAL | 0 refills | Status: AC
Start: 2023-06-12 — End: ?

## 2023-06-12 NOTE — Patient Instructions (Signed)
I am not sure the cause of your symptoms. It may be due to a vitamin problem or as a result of headaches.  I would like to get blood work today.  I am also prescribing a steroid dose pack for 6 days to try to get rid of your headache and see if your other symptoms get better too.  I will see you back in clinic in 1 month to see how you are doing and if there is anything else we need to do.  Please let me know if you have any questions or concerns in the meantime.   The physicians and staff at [facility] are committed to providing excellent care. You may receive a survey requesting feedback about your experience at our office. We strive to receive "very good" responses to the survey questions. If you feel that your experience would prevent you from giving the office a "very good " response, please contact our office to try to remedy the situation. We may be reached at [phone]. Thank you for taking the time out of your busy day to complete the survey.  Jacquelyne Balint, MD [facility]

## 2023-06-13 LAB — VITAMIN B12: Vitamin B-12: 276 pg/mL (ref 211–911)

## 2023-06-20 LAB — VITAMIN B6: Vitamin B6: 31.5 ng/mL — ABNORMAL HIGH (ref 2.1–21.7)

## 2023-06-20 LAB — IMMUNOFIXATION ELECTROPHORESIS
IgG (Immunoglobin G), Serum: 1124 mg/dL (ref 600–1640)
IgM, Serum: 88 mg/dL (ref 50–300)
Immunoglobulin A: 180 mg/dL (ref 47–310)

## 2023-06-20 LAB — VITAMIN B1: Vitamin B1 (Thiamine): 8 nmol/L (ref 8–30)

## 2023-06-30 NOTE — Progress Notes (Signed)
NEUROLOGY FOLLOW UP OFFICE NOTE  Ricky Duran [id number]  Subjective:  Ricky Duran is a 37 y.o. year old  right-handed male with no medical history who we last saw on 06/12/23 for diffuse paresthesias and headache.  To briefly review: Symptoms started about 4 weeks ago with abnormal sensation in entire body, including the face, and was on both sides of the body. He describes the symptoms as feeling like a bee sting. It was constant for the first 3 weeks, then started coming and going over the last week and changed a bit. The tingling is now on the left side of his face and is associated with headaches. The symptoms in his body come and go. He describes the headaches as frontal headache, like a cold sensation in his head. It will come and go. It will not occur during sleep. It may start after a 4-5 hours after waking up. It lasts for many hours. He has a headache every day. The headaches started when his symptoms started 4 weeks ago, but was not as strong as they are currently. He denies photophobia, phonophobia, nausea, vomiting, or neck pain. He has not taken anything for the symptoms.   Prior to symptoms, he did have a sore throat. He had labs that did not show anything and his resolved. He has never had similar symptoms in the past. He did not really have headaches prior to this (no previous history of headaches).   He denies any muscle weakness, but does occasionally have cramps in his feet.   Patient is not on any medications and does not take any supplements. He does not drink caffeine or energy drinks.   He does not smoke. He drinks EtOH only occasionally. He denies drug use.   Family history of neuropathy/myopathy/neurologic disease/headaches? No   He does not report any constitutional symptoms like fever, night sweats, anorexia or unintentional weight loss.   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  Most recent Assessment and Plan  (06/12/23): Ricky Duran is a 37 y.o. male who presents for evaluation of diffuse paresthesias and headaches. He has no relevant medical history. His neurological examination is normal today. Available diagnostic data is significant for HbA1c of 5.8, normal folate, HIV and RPR non-reactive, TSH normal, CRP normal. The etiology of patient's symptoms is currently unclear. It is difficult to localize symptoms. To involve the face, the brain is the only localization, but to be on both sides and affect only sensory is unlikely. A vitamin deficiency is certainly possible. His headaches do not have migrainous sound, but there may be limitations of language barrier, so symptoms being secondary to atypical migraine is also possible. I will send labs and treat with a medrol dose pak to break current headache cycle and see if this may also resolve paresthesias. He was not a headache person prior, so it is unclear if he will need a headache medication going forward. I will monitor this closely.   PLAN: -Blood work: B1, B6, B12, IFE -May consider MRI brain w/wo contrast - though likely lower yield given diffuse, sensory only symptoms -Will give Medrol dose pak to break current headache cycle -May consider headache medication if symptoms persist -Patient to call with persistent or worsening symptoms  Since their last visit: Labs were significant for borderline B12 (276). I recommended supplementation with 1000 mcg daily. Patient is taking this. His B6 was mildly elevated. He does not take any supplements with B6 or drink energy  drinks.  Patient states his symptoms have improved. Patient has intermittent itching and paresthesias, but only rarely, and not as severe as before. His headache has also resolved (after the medrol dosepak).  MEDICATIONS:  Outpatient Encounter Medications as of 07/14/2023  Medication Sig   methylPREDNISolone (MEDROL DOSEPAK) 4 MG TBPK tablet Take 6tabs x1day, then 5tabs x1day, then  4tabs x1day, then 3tabs x1day, then 2tabs x1day, then 1tab x1day, then STOP   No facility-administered encounter medications on file as of 07/14/2023.    PAST MEDICAL HISTORY: History reviewed. No pertinent past medical history.  PAST SURGICAL HISTORY: History reviewed. No pertinent surgical history.  ALLERGIES: No Known Allergies  FAMILY HISTORY: History reviewed. No pertinent family history.  SOCIAL HISTORY: Social History   Tobacco Use   Smoking status: Some Days    Types: Cigarettes   Smokeless tobacco: Never  Vaping Use   Vaping status: Never Used  Substance Use Topics   Alcohol use: Yes    Comment: occasionally   Drug use: Never   Social History   Social History Narrative   Are you right handed or left handed? Right   Are you currently employed ?    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   Who lives with you? roommate   What type of home do you live in: 1 story or 2 story? one    Caffeine none      Objective:  Vital Signs:  BP 106/70   Pulse 64   Wt 150 lb (68 kg)   SpO2 99%   BMI 22.15 kg/m   General: No acute distress.  Patient appears well-groomed.   Head:  Normocephalic/atraumatic Neck: supple, no paraspinal tenderness, full range of motion Lungs:  Non-labored breathing on room air  Neurological Exam: alert and oriented.  Speech fluent and not dysarthric, language intact.  CN II-XII intact. Bulk and tone normal, muscle strength 5/5 throughout.  Sensation to light touch intact.  Deep tendon reflexes 2+ throughout, toes downgoing.  Finger to nose testing intact.  Gait normal, Romberg negative.  Labs and Imaging review: New results: 06/12/23: B1 wnl B12: 276 B6 mildly elevated to 31.5 IFE: no M protein  Previously reviewed results: 05/20/23: TSH wnl RPR non-reactive HIV non-reactive CRP wnl HbA1c: 5.8 Hepatic function panel wnl BMP unremarkable CBC w/ diff unremarkable Folate wnl  Assessment/Plan:   This is Ricky Duran, a 37 y.o. male with: Borderline B12 Headache - resolved Paraesthesias - improving; may be secondary to borderline B12 or headache  Plan: -Continue B12 1000 mcg daily  Return to clinic as needed   Jacquelyne Balint, MD

## 2023-07-14 ENCOUNTER — Encounter: Payer: Self-pay | Admitting: Neurology

## 2023-07-14 ENCOUNTER — Ambulatory Visit: Payer: Managed Care, Other (non HMO) | Admitting: Neurology

## 2023-07-14 VITALS — BP 106/70 | HR 64 | Wt 150.0 lb

## 2023-07-14 DIAGNOSIS — R209 Unspecified disturbances of skin sensation: Secondary | ICD-10-CM | POA: Diagnosis not present

## 2023-07-14 DIAGNOSIS — R519 Headache, unspecified: Secondary | ICD-10-CM | POA: Diagnosis not present

## 2023-07-14 DIAGNOSIS — E538 Deficiency of other specified B group vitamins: Secondary | ICD-10-CM | POA: Diagnosis not present

## 2023-07-14 NOTE — Patient Instructions (Addendum)
Continue B12 1000 mcg every day.  Follow up with me as needed.  The physicians and staff at [facility] are committed to providing excellent care. You may receive a survey requesting feedback about your experience at our office. We strive to receive "very good" responses to the survey questions. If you feel that your experience would prevent you from giving the office a "very good " response, please contact our office to try to remedy the situation. We may be reached at [phone]. Thank you for taking the time out of your busy day to complete the survey.  Jacquelyne Balint, MD [facility]

## 2024-05-06 DIAGNOSIS — R350 Frequency of micturition: Secondary | ICD-10-CM | POA: Diagnosis not present

## 2024-05-06 DIAGNOSIS — R351 Nocturia: Secondary | ICD-10-CM | POA: Diagnosis not present

## 2024-05-06 DIAGNOSIS — R3915 Urgency of urination: Secondary | ICD-10-CM | POA: Diagnosis not present

## 2024-06-17 DIAGNOSIS — R350 Frequency of micturition: Secondary | ICD-10-CM | POA: Diagnosis not present

## 2024-06-17 DIAGNOSIS — N3943 Post-void dribbling: Secondary | ICD-10-CM | POA: Diagnosis not present

## 2024-06-17 DIAGNOSIS — R3912 Poor urinary stream: Secondary | ICD-10-CM | POA: Diagnosis not present

## 2024-08-18 DIAGNOSIS — L700 Acne vulgaris: Secondary | ICD-10-CM | POA: Diagnosis not present

## 2024-08-20 DIAGNOSIS — R3912 Poor urinary stream: Secondary | ICD-10-CM | POA: Diagnosis not present

## 2024-08-26 DIAGNOSIS — K5900 Constipation, unspecified: Secondary | ICD-10-CM | POA: Diagnosis not present

## 2024-08-26 DIAGNOSIS — R140 Abdominal distension (gaseous): Secondary | ICD-10-CM | POA: Diagnosis not present

## 2024-08-26 DIAGNOSIS — M549 Dorsalgia, unspecified: Secondary | ICD-10-CM | POA: Diagnosis not present

## 2024-09-03 DIAGNOSIS — R1310 Dysphagia, unspecified: Secondary | ICD-10-CM | POA: Diagnosis not present

## 2024-09-03 DIAGNOSIS — R1011 Right upper quadrant pain: Secondary | ICD-10-CM | POA: Diagnosis not present

## 2024-09-03 DIAGNOSIS — K5900 Constipation, unspecified: Secondary | ICD-10-CM | POA: Diagnosis not present

## 2024-09-03 DIAGNOSIS — R140 Abdominal distension (gaseous): Secondary | ICD-10-CM | POA: Diagnosis not present
